# Patient Record
Sex: Male | Born: 1990 | Race: White | Hispanic: No | Marital: Single | Smoking: Current every day smoker
Health system: Southern US, Community
[De-identification: ages and names within clinical notes are randomized; demographics above are authoritative.]

## PROBLEM LIST (undated history)

## (undated) DIAGNOSIS — F419 Anxiety disorder, unspecified: Secondary | ICD-10-CM

---
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
## 2017-06-11 ENCOUNTER — Emergency Department
Admission: EM | Admit: 2017-06-11 | Discharge: 2017-06-11 | Disposition: A | Discharge status: Home / Self Care | Payer: Self-pay | Attending: Emergency Medicine | Admitting: Emergency Medicine

## 2017-06-11 ENCOUNTER — Encounter: Payer: Self-pay | Admitting: Emergency Medicine

## 2017-06-11 DIAGNOSIS — F17210 Nicotine dependence, cigarettes, uncomplicated: Secondary | ICD-10-CM | POA: Insufficient documentation

## 2017-06-11 DIAGNOSIS — W57XXXA Bitten or stung by nonvenomous insect and other nonvenomous arthropods, initial encounter: Secondary | ICD-10-CM | POA: Insufficient documentation

## 2017-06-11 DIAGNOSIS — E860 Dehydration: Secondary | ICD-10-CM | POA: Insufficient documentation

## 2017-06-11 DIAGNOSIS — L989 Disorder of the skin and subcutaneous tissue, unspecified: Secondary | ICD-10-CM | POA: Insufficient documentation

## 2017-06-11 NOTE — ED Notes (Signed)
Patient presents to the ED after removing a tick this morning.  Patient states since then he is having muscle aches, tremors, poor circulation, and dry mouth.  Patient's capillary refill is within normal limits.  Patient is speaking clearly with even and not labored respirations.  No obvious distress at this time.  Patient states he had some of those symptoms when he was younger and would get stung by a bee.

## 2017-06-11 NOTE — ED Provider Notes (Signed)
[facility] Emergency Department Provider Note  ____________________________________________  Time seen: Approximately 3:48 PM  I have reviewed the triage vital signs and the nursing notes.   HISTORY  Chief Complaint Tick Removal    HPI Seth Nicholson is a 26 y.o. male who presents emergency department concerned about a tick bite. Patient reports that he was out all night at a friend's house and they weredrinking on the lids. Patient reports that this morning he awoke and saw a tick on his right lower extremity. Patient removed the tick successfully. Today, patient has felt "off" and reports that he is concerned he may have Lyme's or [facility] spotted fever. Patient reports that he consumed a significant amount of alcohol last night drinking for over 8 hours. Patient has not had any non-caffeinated fluids today. He reports that he feels slightly shaky, mild headache, mild nausea. No other complaints with no visual changes, chills, neck pain, chest pain, shortness breath, abdominal pain, vomiting, diarrhea, rashes.   History reviewed. No pertinent past medical history.  There are no active problems to display for this patient.   History reviewed. No pertinent surgical history.  Prior to Admission medications   Not on File    Allergies Barbiturates  No family history on file.  Social History Social History  Substance Use Topics  . Smoking status: Current Every Day Smoker    Packs/day: 1.00    Types: Cigarettes  . Smokeless tobacco: Not on file  . Alcohol use Not on file     Review of Systems  Constitutional: No fever/chills Eyes: No visual changes.  Cardiovascular: no chest pain. Respiratory: no cough. No SOB. Gastrointestinal: No abdominal pain.  No nausea, no vomiting.  No diarrhea.  No constipation. Musculoskeletal: Negative for musculoskeletal pain. Skin: Negative for rash, abrasions, lacerations, ecchymosis.Positive for tick bite to  the right lower extremity. Neurological: Negative for headaches, focal weakness or numbness. 10-point ROS otherwise negative.  ____________________________________________   PHYSICAL EXAM:  VITAL SIGNS: ED Triage Vitals  Enc Vitals Group     BP 06/11/17 1527 (!) 161/100     Pulse Rate 06/11/17 1527 (!) 102     Resp 06/11/17 1527 18     Temp 06/11/17 1527 98.1 F (36.7 C)     Temp Source 06/11/17 1527 Oral     SpO2 06/11/17 1527 96 %     Weight 06/11/17 1528 200 lb (90.7 kg)     Height 06/11/17 1528 5\' 8"  (1.727 m)     Head Circumference --      Peak Flow --      Pain Score --      Pain Loc --      Pain Edu? --      Excl. in GC? --      Constitutional: Alert and oriented. Well appearing and in no acute distress. Eyes: Conjunctivae are normal. PERRL. EOMI. Head: Atraumatic. ENT:      Ears:       Nose: No congestion/rhinnorhea.      Mouth/Throat: Mucous membranes are moist.  Neck: No stridor.   Hematological/Lymphatic/Immunilogical: No cervical lymphadenopathy. Cardiovascular: Normal rate, regular rhythm. Normal S1 and S2.  Good peripheral circulation. Respiratory: Normal respiratory effort without tachypnea or retractions. Lungs CTAB. Good air entry to the bases with no decreased or absent breath sounds. Musculoskeletal: Full range of motion to all extremities. No gross deformities appreciated. Neurologic:  Normal speech and language. No gross focal neurologic deficits are appreciated.  Skin:  Skin is warm, dry and intact. No rash noted. Punctate lesion noted to the right lower extremity from tick bite. No remaining tick bite. No surrounding lesions or rashes. Psychiatric: Mood and affect are normal. Speech and behavior are normal. Patient exhibits appropriate insight and judgement.   ____________________________________________   LABS (all labs ordered are listed, but only abnormal results are displayed)  Labs Reviewed - No data to  display ____________________________________________  EKG   ____________________________________________  RADIOLOGY   No results found.  ____________________________________________    PROCEDURES  Procedure(s) performed:    Procedures    Medications - No data to display   ____________________________________________   INITIAL IMPRESSION / ASSESSMENT AND PLAN / ED COURSE  Pertinent labs & imaging results that were available during my care of the patient were reviewed by me and considered in my medical decision making (see chart for details).  Review of the [place] CSRS was performed in accordance of the [facility] prior to dispensing any controlled drugs.     Patient's diagnosis is consistent with tick bite. Patient presented to the emergency department with vague symptoms status post tick bite yesterday. Patient has also been drinking significant amounts of alcohol. Patient's symptoms are more consistent with a newer and mild dehydration. Tick was on patient less than 72 hours, no surrounding rashes, no other symptoms consistent with tick borne illness. At this time, no indication for labs or empiric treatment with doxycycline. She will follow up primary care as needed. Patient is given ED precautions to return to the ED for any worsening or new symptoms.     ____________________________________________  FINAL CLINICAL IMPRESSION(S) / ED DIAGNOSES  Final diagnoses:  Tick bite, initial encounter      NEW MEDICATIONS STARTED DURING THIS VISIT:  New Prescriptions   No medications on file        This chart was dictated using voice recognition software/Dragon. Despite best efforts to proofread, errors can occur which can change the meaning. Any change was purely unintentional.    Racheal Patches, PA-C 06/11/17 1553    Sharman Cheek, MD 06/13/17 0120

## 2017-06-11 NOTE — ED Triage Notes (Signed)
States removed tick from R shin this am. Concerned about contracting tick bourne disease.

## 2018-02-28 ENCOUNTER — Other Ambulatory Visit: Payer: Self-pay

## 2018-02-28 ENCOUNTER — Emergency Department
Admission: EM | Admit: 2018-02-28 | Discharge: 2018-02-28 | Disposition: A | Discharge status: Home / Self Care | Payer: BLUE CROSS/BLUE SHIELD | Attending: Emergency Medicine | Admitting: Emergency Medicine

## 2018-02-28 ENCOUNTER — Encounter: Payer: Self-pay | Admitting: Emergency Medicine

## 2018-02-28 DIAGNOSIS — F419 Anxiety disorder, unspecified: Secondary | ICD-10-CM | POA: Diagnosis not present

## 2018-02-28 DIAGNOSIS — R519 Headache, unspecified: Secondary | ICD-10-CM

## 2018-02-28 DIAGNOSIS — R51 Headache: Secondary | ICD-10-CM | POA: Diagnosis present

## 2018-02-28 DIAGNOSIS — F17210 Nicotine dependence, cigarettes, uncomplicated: Secondary | ICD-10-CM | POA: Insufficient documentation

## 2018-02-28 LAB — BASIC METABOLIC PANEL
Anion gap: 11 (ref 5–15)
BUN: 11 mg/dL (ref 6–20)
CO2: 24 mmol/L (ref 22–32)
CREATININE: 0.88 mg/dL (ref 0.61–1.24)
Calcium: 9.2 mg/dL (ref 8.9–10.3)
Chloride: 100 mmol/L — ABNORMAL LOW (ref 101–111)
GFR calc Af Amer: >60 mL/min (ref >60)
GLUCOSE: 96 mg/dL (ref 65–99)
Potassium: 3.5 mmol/L (ref 3.5–5.1)
SODIUM: 135 mmol/L (ref 135–145)

## 2018-02-28 LAB — TROPONIN I: Troponin I: <0.03 ng/mL (ref <0.03)

## 2018-02-28 LAB — CBC
HEMATOCRIT: 46.1 % (ref 40.0–52.0)
Hemoglobin: 16.2 g/dL (ref 13.0–18.0)
MCH: 31.6 pg (ref 26.0–34.0)
MCHC: 35.2 g/dL (ref 32.0–36.0)
MCV: 89.8 fL (ref 80.0–100.0)
PLATELETS: 309 10*3/uL (ref 150–440)
RBC: 5.13 MIL/uL (ref 4.40–5.90)
RDW: 12.5 % (ref 11.5–14.5)
WBC: 10.8 10*3/uL — AB (ref 3.8–10.6)

## 2018-02-28 NOTE — ED Triage Notes (Signed)
Heart racing and headache since 10am.  Was up all night drinking til 4or 5am.

## 2018-02-28 NOTE — ED Provider Notes (Signed)
[facility] Emergency Department Provider Note  ____________________________________________   First MD Initiated Contact with Patient 02/28/18 1329     (approximate)  I have reviewed the triage vital signs and the nursing notes.   HISTORY  Chief Complaint Headache and Palpitations   HPI Seth Nicholson is a 27 y.o. male who presents to the emergency department for treatment and evaluation of palpitations and headache.  Patient states that he was "up all night drinking" and then when he tried to lay down, he felt that his heart was beating too fast and may be skipping a beat which made him feel "panicky".  He states that 2 days ago he did some cocaine, but has not had any chest pain or shortness of breath since that time.  No alleviating measures have been attempted for this complaint prior to arrival.  History reviewed. No pertinent past medical history.  There are no active problems to display for this patient.   History reviewed. No pertinent surgical history.  Prior to Admission medications   Not on File    Allergies Barbiturates  No family history on file.  Social History Social History   Tobacco Use  . Smoking status: Current Every Day Smoker    Packs/day: 1.00    Types: Cigarettes  . Smokeless tobacco: Never Used  Substance Use Topics  . Alcohol use: Yes  . Drug use: Yes    Types: Cocaine    Review of Systems  Constitutional: No fever/chills Eyes: No visual changes. ENT: No sore throat. Cardiovascular: Denies chest pain.  Positive for palpitations Respiratory: Denies shortness of breath. Gastrointestinal: No abdominal pain.  No nausea, no vomiting.  No diarrhea.  No constipation. Genitourinary: Negative for dysuria. Musculoskeletal: Negative for back pain. Skin: Negative for rash. Neurological: Negative for headaches, focal weakness or numbness. ____________________________________________   PHYSICAL EXAM:  VITAL  SIGNS: ED Triage Vitals [02/28/18 1112]  Enc Vitals Group     BP (!) 164/96     Pulse Rate 95     Resp 16     Temp 98.4 F (36.9 C)     Temp Source Oral     SpO2 98 %     Weight 190 lb (86.2 kg)     Height 5\' 8"  (1.727 m)     Head Circumference      Peak Flow      Pain Score 5     Pain Loc      Pain Edu?      Excl. in GC?     Constitutional: Alert and oriented. Well appearing and in no acute distress. Eyes: Conjunctivae are normal. PERRL. Head: Atraumatic. Nose: No congestion/rhinnorhea. Mouth/Throat: Mucous membranes are moist.  Oropharynx non-erythematous. Neck: No stridor.   Cardiovascular: Normal rate, regular rhythm. Grossly normal heart sounds.  Good peripheral circulation. Respiratory: Normal respiratory effort.  No retractions. Lungs CTAB. Gastrointestinal: Soft and nontender. No distention. No abdominal bruits. No CVA tenderness. Musculoskeletal: No lower extremity tenderness nor edema.  No joint effusions. Neurologic:  Normal speech and language. No gross focal neurologic deficits are appreciated. No gait instability. Skin:  Skin is warm, dry and intact. No rash noted. Psychiatric: Mood and affect are normal. Speech and behavior are normal.  ____________________________________________   LABS (all labs ordered are listed, but only abnormal results are displayed)  Labs Reviewed  BASIC METABOLIC PANEL - Abnormal; Notable for the following components:      Result Value   Chloride 100 (*)  All other components within normal limits  CBC - Abnormal; Notable for the following components:   WBC 10.8 (*)    All other components within normal limits  TROPONIN I   ____________________________________________  EKG  Normal sinus rhythm, normal axis, no ectopy ____________________________________________  RADIOLOGY  Official radiology report(s): No results found.  ____________________________________________   PROCEDURES  Procedure(s) performed:  None  Procedures  Critical Care performed: No  ____________________________________________   INITIAL IMPRESSION / ASSESSMENT AND PLAN / ED COURSE  As part of my medical decision making, I reviewed the following data within the electronic MEDICAL RECORD NUMBER   27 year old male presenting to the emergency department for evaluation of palpitations and headache. Symptoms have resolved while here. He feels reassured by his negative lab studies and EKG. He was strongly advised to get alcohol and drug treatment. Patient states that he has plans to stop all his high risk behaviors because it is "time to grow up." He was advised to return to the ER for symptoms that change or worsen or for new concerns. ____________________________________________   FINAL CLINICAL IMPRESSION(S) / ED DIAGNOSES  Final diagnoses:  Anxiety  Intractable episodic headache, unspecified headache type     ED Discharge Orders    None       Note:  This document was prepared using Dragon voice recognition software and may include unintentional dictation errors.    Chinita Pester, FNP 02/28/18 1620    Jene Every, MD 03/03/18 [phone]

## 2018-02-28 NOTE — ED Provider Notes (Signed)
ED ECG REPORT I, Seth Nicholson, the attending physician, personally viewed and interpreted this ECG.  Date: 02/28/2018  Rhythm: normal sinus rhythm QRS Axis: normal Intervals: normal ST/T Wave abnormalities: normal Narrative Interpretation: no evidence of acute ischemia    Seth EveryKinner, Krystale Rinkenberger, MD 02/28/18 1413

## 2018-07-02 ENCOUNTER — Other Ambulatory Visit: Payer: Self-pay

## 2018-07-02 ENCOUNTER — Emergency Department
Admission: EM | Admit: 2018-07-02 | Discharge: 2018-07-02 | Disposition: A | Discharge status: Home / Self Care | Payer: BLUE CROSS/BLUE SHIELD | Attending: Emergency Medicine | Admitting: Emergency Medicine

## 2018-07-02 ENCOUNTER — Encounter: Payer: Self-pay | Admitting: Emergency Medicine

## 2018-07-02 DIAGNOSIS — F17210 Nicotine dependence, cigarettes, uncomplicated: Secondary | ICD-10-CM | POA: Diagnosis not present

## 2018-07-02 DIAGNOSIS — Y999 Unspecified external cause status: Secondary | ICD-10-CM | POA: Insufficient documentation

## 2018-07-02 DIAGNOSIS — Y939 Activity, unspecified: Secondary | ICD-10-CM | POA: Insufficient documentation

## 2018-07-02 DIAGNOSIS — B354 Tinea corporis: Secondary | ICD-10-CM | POA: Diagnosis not present

## 2018-07-02 DIAGNOSIS — R51 Headache: Secondary | ICD-10-CM | POA: Diagnosis present

## 2018-07-02 DIAGNOSIS — S80861A Insect bite (nonvenomous), right lower leg, initial encounter: Secondary | ICD-10-CM | POA: Diagnosis not present

## 2018-07-02 DIAGNOSIS — Y929 Unspecified place or not applicable: Secondary | ICD-10-CM | POA: Diagnosis not present

## 2018-07-02 DIAGNOSIS — W57XXXA Bitten or stung by nonvenomous insect and other nonvenomous arthropods, initial encounter: Secondary | ICD-10-CM | POA: Diagnosis not present

## 2018-07-02 MED ORDER — ONDANSETRON 4 MG PO TBDP: 4.0000 mg | ORAL_TABLET | Freq: Once | ORAL | Status: DC

## 2018-07-02 MED ORDER — TERBINAFINE HCL 1 % EX CREA: 1.0000 "application " | TOPICAL_CREAM | Freq: Two times a day (BID) | CUTANEOUS | 1 refills | Status: DC

## 2018-07-02 MED ORDER — DOXYCYCLINE HYCLATE 100 MG PO CAPS: 100.0000 mg | ORAL_CAPSULE | Freq: Two times a day (BID) | ORAL | 0 refills | Status: DC

## 2018-07-02 MED ORDER — ONDANSETRON 4 MG PO TBDP
ORAL_TABLET | ORAL | Status: AC
  Filled 2018-07-02: qty 1

## 2018-07-02 NOTE — Discharge Instructions (Addendum)
Follow-up with [facility] acute care or primary care of your choice. Begin taking doxycycline twice daily for the next 10 days.  This is for your tick bite. Use Lamisil cream to your rash on your forearm and abdomen.  This medication is for your ringworm.  This cream also has 1 refill.

## 2018-07-02 NOTE — ED Provider Notes (Signed)
[facility] Emergency Department Provider Note  ____________________________________________   First MD Initiated Contact with Patient 07/02/18 0801     (approximate)  I have reviewed the triage vital signs and the nursing notes.   HISTORY  Chief Complaint Tick Removal  HPI Seth Nicholson is a 27 y.o. male is here with complaint of headache and fever at home since pulling a tick off his ankle 2 days ago.  Patient states the area itches.  He believes that the tick had a spot on it making him think that this is a [place] tick.  He has removed the tick intact but still continues to have problems in this area.  He also has a rash to his forearm and on his right abdomen that has been there for several weeks.  He states the areas seem to be getting bigger.  Occasionally they itch.  He also is very anxious.  He states that he drank 12 beers last evening.  He denies the use of any recreational drugs.  He denies any pain.   History reviewed. No pertinent past medical history.  There are no active problems to display for this patient.   History reviewed. No pertinent surgical history.  Prior to Admission medications   Medication Sig Start Date End Date Taking? Authorizing Provider  doxycycline (VIBRAMYCIN) 100 MG capsule Take 1 capsule (100 mg total) by mouth 2 (two) times daily. 07/02/18   Tommi RumpsSummers, Jourdyn Hasler L, PA-C  terbinafine (LAMISIL) 1 % cream Apply 1 application topically 2 (two) times daily. 07/02/18   Tommi RumpsSummers, Rayshad Riviello L, PA-C    Allergies Barbiturates  No family history on file.  Social History Social History   Tobacco Use  . Smoking status: Current Every Day Smoker    Packs/day: 1.00    Types: Cigarettes  . Smokeless tobacco: Never Used  Substance Use Topics  . Alcohol use: Yes  . Drug use: Yes    Types: Cocaine    Review of Systems Constitutional: No fever/chills Cardiovascular: Denies chest pain. Respiratory: Denies shortness of  breath. Gastrointestinal: No abdominal pain.  No nausea, no vomiting.   Musculoskeletal: Negative for back pain. Skin: Single tick bite.  Positive for rash. Neurological: Negative for headaches, focal weakness or numbness. ____________________________________________   PHYSICAL EXAM:  VITAL SIGNS: ED Triage Vitals  Enc Vitals Group     BP 07/02/18 0738 (!) 152/93     Pulse Rate 07/02/18 0736 (!) 107     Resp 07/02/18 0736 16     Temp 07/02/18 0736 98.5 F (36.9 C)     Temp Source 07/02/18 0736 Oral     SpO2 07/02/18 0736 97 %     Weight 07/02/18 0738 185 lb (83.9 kg)     Height 07/02/18 0738 5\' 8"  (1.727 m)     Head Circumference --      Peak Flow --      Pain Score 07/02/18 0738 0     Pain Loc --      Pain Edu? --      Excl. in GC? --    Constitutional: Alert and oriented. Well appearing and in no acute distress. Eyes: Conjunctivae are normal.  Head: Atraumatic. Nose: No congestion/rhinnorhea. Mouth/Throat: Mucous membranes are moist.  Oropharynx non-erythematous. Neck: No stridor.   Hematological/Lymphatic/Immunilogical: No cervical lymphadenopathy. Cardiovascular: Normal rate, regular rhythm. Grossly normal heart sounds.  Good peripheral circulation. Respiratory: Normal respiratory effort.  No retractions. Lungs CTAB. Musculoskeletal: Moves upper and lower extremities without  any difficulty.  Normal gait was noted. Neurologic:  Normal speech and language. No gross focal neurologic deficits are appreciated.  Skin:  Skin is warm, dry and intact.  There is a single bite to the right ankle that is erythematous.  No drainage is noted.  No soft tissue swelling is present in the area.  On the forearm and abdomen there is a circular area x3 with discrete margins and central clearing consistent with tinea corpus.  Area is nontender to touch. Psychiatric: Mood and affect are normal. Speech and behavior are normal.  ____________________________________________   LABS (all labs  ordered are listed, but only abnormal results are displayed)  Labs Reviewed - No data to display   PROCEDURES  Procedure(s) performed: None  Procedures  Critical Care performed: No  ____________________________________________   INITIAL IMPRESSION / ASSESSMENT AND PLAN / ED COURSE  As part of my medical decision making, I reviewed the following data within the electronic MEDICAL RECORD NUMBER Notes from prior ED visits and [place] Controlled Substance Database  Patient is very anxious about his tick bite.  Patient was placed on doxycycline 100 mg twice daily for 10 days.  Lamisil 1% cream to apply to area twice daily.  Patient is to follow-up with [facility] if any continued problems.  ____________________________________________   FINAL CLINICAL IMPRESSION(S) / ED DIAGNOSES  Final diagnoses:  Tick bite of lower leg, right, initial encounter  Tinea corporis     ED Discharge Orders        Ordered    doxycycline (VIBRAMYCIN) 100 MG capsule  2 times daily     07/02/18 0907    terbinafine (LAMISIL) 1 % cream  2 times daily     07/02/18 0907       Note:  This document was prepared using Dragon voice recognition software and may include unintentional dictation errors.    Tommi Rumps, PA-C 07/02/18 1544    Jeanmarie Plant, MD 07/07/18 [phone]

## 2018-07-02 NOTE — ED Notes (Addendum)
See triage note  Presents with tick bite to right ankle /lower leg  States he thinks the tick was there for about 2 days  Area is red  No swelling  But states he woke up this am with diaphoresis and feeling very anxious  Also states he is dizzy and nauseated   Afebrile on arrival

## 2018-07-02 NOTE — ED Notes (Signed)
First Nurse Note: pt c/o tick bite 2 days ago. Pt in NAD

## 2018-07-02 NOTE — ED Triage Notes (Signed)
Pt states that he was bit by a tick on his right ankle 2 days ago. Pt states that this morning he had fever of 100.0, pt afebrile in triage, denies taking anything for fever. PT states that he also has dry mouth, and the bit itches and is swollen. Pt is in NAD.

## 2018-08-01 ENCOUNTER — Encounter: Payer: Self-pay | Admitting: Emergency Medicine

## 2018-08-01 ENCOUNTER — Other Ambulatory Visit: Payer: Self-pay

## 2018-08-01 ENCOUNTER — Emergency Department: Payer: BLUE CROSS/BLUE SHIELD

## 2018-08-01 ENCOUNTER — Emergency Department
Admission: EM | Admit: 2018-08-01 | Discharge: 2018-08-01 | Disposition: A | Discharge status: Home / Self Care | Payer: BLUE CROSS/BLUE SHIELD | Attending: Emergency Medicine | Admitting: Emergency Medicine

## 2018-08-01 DIAGNOSIS — F17210 Nicotine dependence, cigarettes, uncomplicated: Secondary | ICD-10-CM | POA: Diagnosis not present

## 2018-08-01 DIAGNOSIS — F410 Panic disorder [episodic paroxysmal anxiety] without agoraphobia: Secondary | ICD-10-CM | POA: Diagnosis not present

## 2018-08-01 DIAGNOSIS — F419 Anxiety disorder, unspecified: Secondary | ICD-10-CM

## 2018-08-01 DIAGNOSIS — Z77098 Contact with and (suspected) exposure to other hazardous, chiefly nonmedicinal, chemicals: Secondary | ICD-10-CM | POA: Insufficient documentation

## 2018-08-01 LAB — CBC WITH DIFFERENTIAL/PLATELET
Basophils Absolute: 0.1 10*3/uL (ref 0–0.1)
Basophils Relative: 0 %
Eosinophils Absolute: 0.1 10*3/uL (ref 0–0.7)
Eosinophils Relative: 1 %
HCT: 45.2 % (ref 40.0–52.0)
HEMOGLOBIN: 16.1 g/dL (ref 13.0–18.0)
Lymphocytes Relative: 11 %
Lymphs Abs: 1.7 10*3/uL (ref 1.0–3.6)
MCH: 32.6 pg (ref 26.0–34.0)
MCHC: 35.6 g/dL (ref 32.0–36.0)
MCV: 91.4 fL (ref 80.0–100.0)
MONOS PCT: 5 %
Monocytes Absolute: 0.7 10*3/uL (ref 0.2–1.0)
NEUTROS PCT: 83 %
Neutro Abs: 12.3 10*3/uL — ABNORMAL HIGH (ref 1.4–6.5)
Platelets: 234 10*3/uL (ref 150–440)
RBC: 4.95 MIL/uL (ref 4.40–5.90)
RDW: 12 % (ref 11.5–14.5)
WBC: 14.8 10*3/uL — AB (ref 3.8–10.6)

## 2018-08-01 LAB — COMPREHENSIVE METABOLIC PANEL
ALK PHOS: 78 U/L (ref 38–126)
ALT: 36 U/L (ref 0–44)
ANION GAP: 11 (ref 5–15)
AST: 26 U/L (ref 15–41)
Albumin: 4.9 g/dL (ref 3.5–5.0)
BUN: 16 mg/dL (ref 6–20)
CALCIUM: 9 mg/dL (ref 8.9–10.3)
CO2: 21 mmol/L — AB (ref 22–32)
Chloride: 103 mmol/L (ref 98–111)
Creatinine, Ser: 0.9 mg/dL (ref 0.61–1.24)
GFR calc Af Amer: >60 mL/min (ref >60)
GFR calc non Af Amer: >60 mL/min (ref >60)
GLUCOSE: 79 mg/dL (ref 70–99)
Potassium: 3.9 mmol/L (ref 3.5–5.1)
Sodium: 135 mmol/L (ref 135–145)
Total Bilirubin: 1.3 mg/dL — ABNORMAL HIGH (ref 0.3–1.2)
Total Protein: 7.9 g/dL (ref 6.5–8.1)

## 2018-08-01 LAB — MAGNESIUM: Magnesium: 2.3 mg/dL (ref 1.7–2.4)

## 2018-08-01 NOTE — Discharge Instructions (Addendum)
Follow-up with [facility] about panic attacks. Consider decreasing the amount of alcohol that you are drinking.  Consider talking to the counselors at [facility] also about this.  Increase fluids today.

## 2018-08-01 NOTE — ED Triage Notes (Signed)
Patient reports that he was using pesticide around his house yesterday and got some on his legs and arms. Reports since then he has noticed burning to legs and left arm. Patient also reports "stickiiness" in eyes and blurred vision since exposure. Patient reports showering to remove chemical this morning. Patient states he is having a panic attack due to symptoms. Reports history of panic attacks.

## 2018-08-01 NOTE — ED Provider Notes (Signed)
[facility] Emergency Department Provider Note  ____________________________________________   First MD Initiated Contact with Patient 08/01/18 1213     (approximate)  I have reviewed the triage vital signs and the nursing notes.   HISTORY  Chief Complaint Allergic Reaction and Panic Attack   HPI Seth Nicholson is a 27 y.o. male presents to the emergency department today with anxiety about being "poisoned by bug spray".  Patient states that last evening he sprayed some chemical bug spray inside his house and then left the house to go to out with friends.  He states that he realized that he had sprayed some of the bug spray on his skin but did not stay to shower it off until this morning.  Patient states that he is now worried that he is been poisoned by the bug spray.  He complains of nausea, abdominal pain, headache, dizziness and playing just not feeling well.  He also admits that while he was out with his friends that he consumed approximately 12 beers.  He denies any vomiting or diarrhea.  He denies any difficulty breathing, talking, swallowing, fever or chills.  He also complains of some blurred vision initially this morning.  He states he has a history of panic attacks in the past.  History reviewed. No pertinent past medical history.  There are no active problems to display for this patient.   History reviewed. No pertinent surgical history.  Prior to Admission medications   Not on File    Allergies Barbiturates  No family history on file.  Social History Social History   Tobacco Use  . Smoking status: Current Every Day Smoker    Packs/day: 1.00    Types: Cigarettes  . Smokeless tobacco: Never Used  Substance Use Topics  . Alcohol use: Yes  . Drug use: Yes    Types: Cocaine    Review of Systems Constitutional: No fever/chills ENT: No sore throat.  Positive blurred vision. Cardiovascular: Denies chest pain. Respiratory: Denies  shortness of breath. Gastrointestinal: No abdominal pain.  Positive nausea, no vomiting.  No diarrhea.  No constipation. Genitourinary: Negative for dysuria. Musculoskeletal: Negative for back pain. Skin: Negative for rash. Neurological: Positive for headaches, negative focal weakness or numbness. Psychological: Positive for panic attacks. ____________________________________________   PHYSICAL EXAM:  VITAL SIGNS: ED Triage Vitals  Enc Vitals Group     BP 08/01/18 1200 (!) 176/99     Pulse Rate 08/01/18 1200 (!) 101     Resp 08/01/18 1200 20     Temp 08/01/18 1200 98.4 F (36.9 C)     Temp Source 08/01/18 1200 Oral     SpO2 08/01/18 1200 97 %     Weight 08/01/18 1201 185 lb (83.9 kg)     Height 08/01/18 1201 5\' 8"  (1.727 m)     Head Circumference --      Peak Flow --      Pain Score 08/01/18 1200 2     Pain Loc --      Pain Edu? --      Excl. in GC? --    Constitutional: Alert and oriented. Well appearing and in no acute distress. Eyes: Conjunctivae are normal.  Head: Atraumatic. Neck: No stridor.   Hematological/Lymphatic/Immunilogical: No cervical lymphadenopathy. Cardiovascular: Normal rate, regular rhythm. Grossly normal heart sounds.  Good peripheral circulation. Respiratory: Normal respiratory effort.  No retractions. Lungs CTAB. Gastrointestinal: Soft and nontender. No distention.  All sounds normoactive x4 quadrants. Musculoskeletal: Moves upper and  lower extremities without any difficulty and normal gait was noted. Neurologic:  Normal speech and language. No gross focal neurologic deficits are appreciated.  Skin:  Skin is warm, dry and intact.  There is a diffuse faint erythema and small areas noted on the anterior portion of the lower legs.  No other rash was noted. Psychiatric: Mood and affect are normal. Speech and behavior are normal.  ____________________________________________   LABS (all labs ordered are listed, but only abnormal results are  displayed)  Labs Reviewed  CBC WITH DIFFERENTIAL/PLATELET - Abnormal; Notable for the following components:      Result Value   WBC 14.8 (*)    Neutro Abs 12.3 (*)    All other components within normal limits  COMPREHENSIVE METABOLIC PANEL - Abnormal; Notable for the following components:   CO2 21 (*)    Total Bilirubin 1.3 (*)    All other components within normal limits  MAGNESIUM  RADIOLOGY  ED MD interpretation:   Chest x-ray is negative for bronchitis or pneumonia.  Official radiology report(s): Dg Chest 2 View  Result Date: 08/01/2018 CLINICAL DATA:  Exposure to pesticide EXAM: CHEST - 2 VIEW COMPARISON:  None. FINDINGS: The heart size and mediastinal contours are within normal limits. Both lungs are clear. The visualized skeletal structures are unremarkable. IMPRESSION: No active cardiopulmonary disease. Electronically Signed   By: Jasmine PangKim  Fujinaga M.D.   On: 08/01/2018 14:04   ____________________________________________   PROCEDURES  Procedure(s) performed: None  Procedures  Critical Care performed: No  ____________________________________________   INITIAL IMPRESSION / ASSESSMENT AND PLAN / ED COURSE  As part of my medical decision making, I reviewed the following data within the electronic MEDICAL RECORD NUMBER Notes from prior ED visits and [place] Controlled Substance Database  Patient was reassured that there is no evidence that he is been systemically poisoned by the bug spray that he used last evening.  We did however discuss his anxiety and this is the third time that he has been seen in the ED for anxiety related issues.  Patient has been given information about [facility] but has not followed through with seeing a counselor.  Today we also discussed his alcohol use and that is been documented twice that he was committed to decreasing his alcohol intake which could help with his anxiety.  He was discharged with information and encouraged to decrease his alcohol use and  follow-up with [facility].  ____________________________________________   FINAL CLINICAL IMPRESSION(S) / ED DIAGNOSES  Final diagnoses:  Anxiety  Exposure to chemical irritant     ED Discharge Orders    None       Note:  This document was prepared using Dragon voice recognition software and may include unintentional dictation errors.    Tommi RumpsSummers, Jamillah Camilo L, PA-C 08/01/18 1553    Arnaldo NatalMalinda, Paul F, MD 08/01/18 [phone]

## 2018-08-01 NOTE — ED Notes (Signed)
Pt is still very anxious.

## 2018-08-01 NOTE — ED Notes (Signed)
See triage note  States he used a pesticide yesterday at home  Thinks he got some on skin and rubbed his eyes  States he showered and rinsed his eyes out but conts to have blurred vision  Also this has thrown him into a panic attack

## 2019-11-30 ENCOUNTER — Emergency Department
Admission: EM | Admit: 2019-11-30 | Discharge: 2019-11-30 | Disposition: A | Discharge status: Home / Self Care | Payer: BLUE CROSS/BLUE SHIELD | Attending: Emergency Medicine | Admitting: Emergency Medicine

## 2019-11-30 ENCOUNTER — Encounter: Payer: Self-pay | Admitting: Emergency Medicine

## 2019-11-30 ENCOUNTER — Other Ambulatory Visit: Payer: Self-pay

## 2019-11-30 DIAGNOSIS — R21 Rash and other nonspecific skin eruption: Secondary | ICD-10-CM | POA: Insufficient documentation

## 2019-11-30 DIAGNOSIS — L259 Unspecified contact dermatitis, unspecified cause: Secondary | ICD-10-CM

## 2019-11-30 DIAGNOSIS — F17210 Nicotine dependence, cigarettes, uncomplicated: Secondary | ICD-10-CM | POA: Diagnosis not present

## 2019-11-30 MED ORDER — PREDNISONE 10 MG PO TABS: 10.0000 mg | ORAL_TABLET | Freq: Every day | ORAL | 0 refills | Status: DC

## 2019-11-30 MED ORDER — TRIAMCINOLONE ACETONIDE 0.1 % EX CREA: 1.0000 "application " | TOPICAL_CREAM | Freq: Four times a day (QID) | CUTANEOUS | 0 refills | Status: DC

## 2019-11-30 NOTE — ED Provider Notes (Signed)
[facility] Emergency Department Provider Note  ____________________________________________   None    (approximate)   I have reviewed the triage vital signs and the nursing notes.       HISTORY  Chief Complaint Rash    HPI Seth Nicholson is a 28 y.o. male presents to the emergency department with a complaint of rash.  Patient presented to emergency department with scattered hives, pruritic lesions scattered across the torso, legs.  Patient states that he noticed a spot to his abdomen 3 to 4 days ago.  This has been spreading.  Patient states that over the counter antihistamines have improved pruritus somewhat but have not improved the rash.  No new foods, medications, soaps, shampoos, topicals, laundry detergents.  Patient states that his girlfriend also experienced symptoms for 2 days but she has resolved but he is worsened.  He denies any other complaints at this time.Marland Kitchen       No past medical history on file.  There are no problems to display for this patient.   No past surgical history on file.  Prior to Admission medications   Medication Sig Start Date End Date Taking? Authorizing Provider  predniSONE (DELTASONE) 10 MG tablet Take 1 tablet (10 mg total) by mouth daily. 11/30/19   Cuthriell, Delorise Royals, PA-C  triamcinolone cream (KENALOG) 0.1 % Apply 1 application topically 4 (four) times daily. 11/30/19   Cuthriell, Delorise Royals, PA-C    Allergies Barbiturates  No family history on file.  Social History Social History   Tobacco Use  . Smoking status: Current Every Day Smoker    Packs/day: 1.00    Types: Cigarettes  . Smokeless tobacco: Never Used  Substance Use Topics  . Alcohol use: Yes  . Drug use: Yes    Types: Cocaine    Review of Systems Constitutional: no fever ENT: no nasal congestion/rhinorhea. no sore throat Cardiovascular: no chest pain. Respiratory: no cough. no shortness of breath/difficulty breathing  Gastroenterology: no abdominal pain Musculoskeletal: no for musculoskeletal pain Integumentary: Scattered rash on torso and extremities Neurological: No focal weakness nor numbness.   ____________________________________________   PHYSICAL EXAM:  VITAL SIGNS: ED Triage Vitals  Enc Vitals Group     BP      Pulse      Resp      Temp      Temp src      SpO2      Weight      Height      Head Circumference      Peak Flow      Pain Score      Pain Loc      Pain Edu?      Excl. in GC?     Constitutional: Alert and oriented. Generally well appearing and in no acute distress. Eyes: Conjunctivae are normal.  Nose: No significant congestion/rhinnorhea. Mouth: No gross oropharyngeal edema. no erythema/edema Neck: No stridor.  No meningeal signs.   Cardiovascular: Grossly normal heart sounds. Respiratory: Normal respiratory effort without significant tachypnea and no observed retractions. Lungs CTAB Gastrointestinal: No significant visible abdominal wall findings.  Bowel sounds x4 quadrants. no tenderness to palpation. Musculoskeletal: No gross deformities of extremities. Neurologic:  Normal speech and language. No gross focal neurologic deficits are appreciated.  Skin:  Skin is warm, dry and intact.  Scattered maculopapular rash with scattered hives noted to the torso, extremities.  No patches.  No significant wheals.  No facial or neck involvement.  ____________________________________________   LABS (all labs ordered are listed, but only abnormal results are displayed)  Labs Reviewed - No data to display  ____________________________________________   RADIOLOGY   Official radiology report(s): No results found.  ____________________________________________    INITIAL IMPRESSION / MDM / ASSESSMENT AND PLAN / ED COURSE  As part of my medical decision making, I reviewed the following data within the electronic MEDICAL RECORD NUMBER Notes from prior ED visits and [place]  Controlled Substance Database      Clinical Impression: Contact dermatitis  Plan: Patient presented to the emergency department with worsening rash.  Over-the-counter medications helped with pruritus but did not improve rash.  No GI complaints.  No systemic involvement.  Patient has had scattered rash for 3 to 4 days.  At this time patient will be placed on steroid taper, triamcinolone topical.  Continue over-the-counter antihistamines as needed.  Follow-up primary care as needed.    ____________________________________________  Note:  This document was prepared using Systems analyst and may include unintentional dictation errors.    Brynda Peon 11/30/19 2141    Nena Polio, MD 11/30/19 [phone]

## 2019-11-30 NOTE — ED Triage Notes (Signed)
Pt reports rash for 3 to 4 days from his neck down. Pt reports using otc claritin and benadry with no relief.

## 2020-02-10 IMAGING — CR DG CHEST 2V
1 series · 2 of 2 positions shown · non-contrast
Comparison: None.

CLINICAL DATA: Exposure to pesticide

EXAM:
CHEST - 2 VIEW

[Series 1: dg chest 2 view · 0.14mm/px · 2 of 2 slices shown]
[im 1/2]
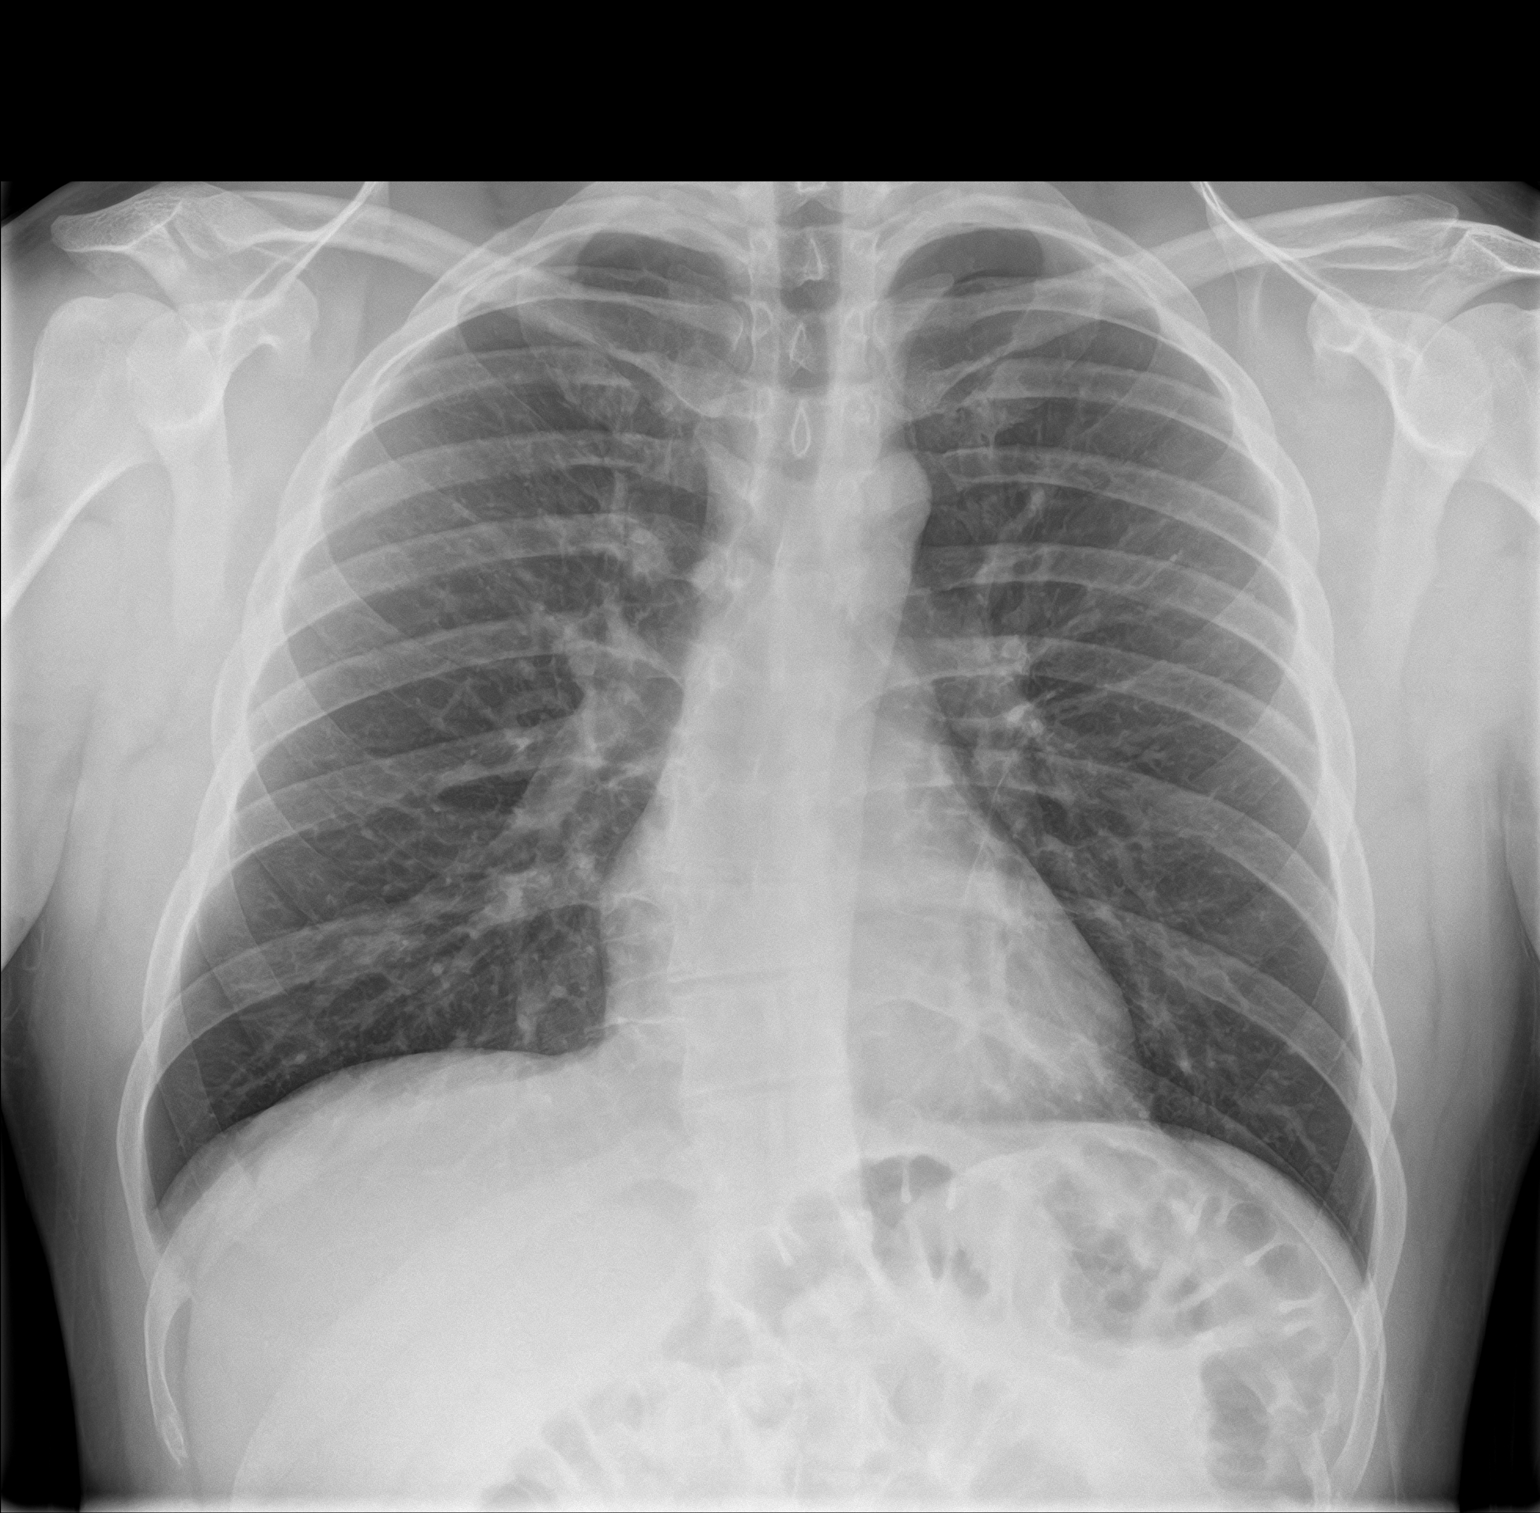
[im 2/2]
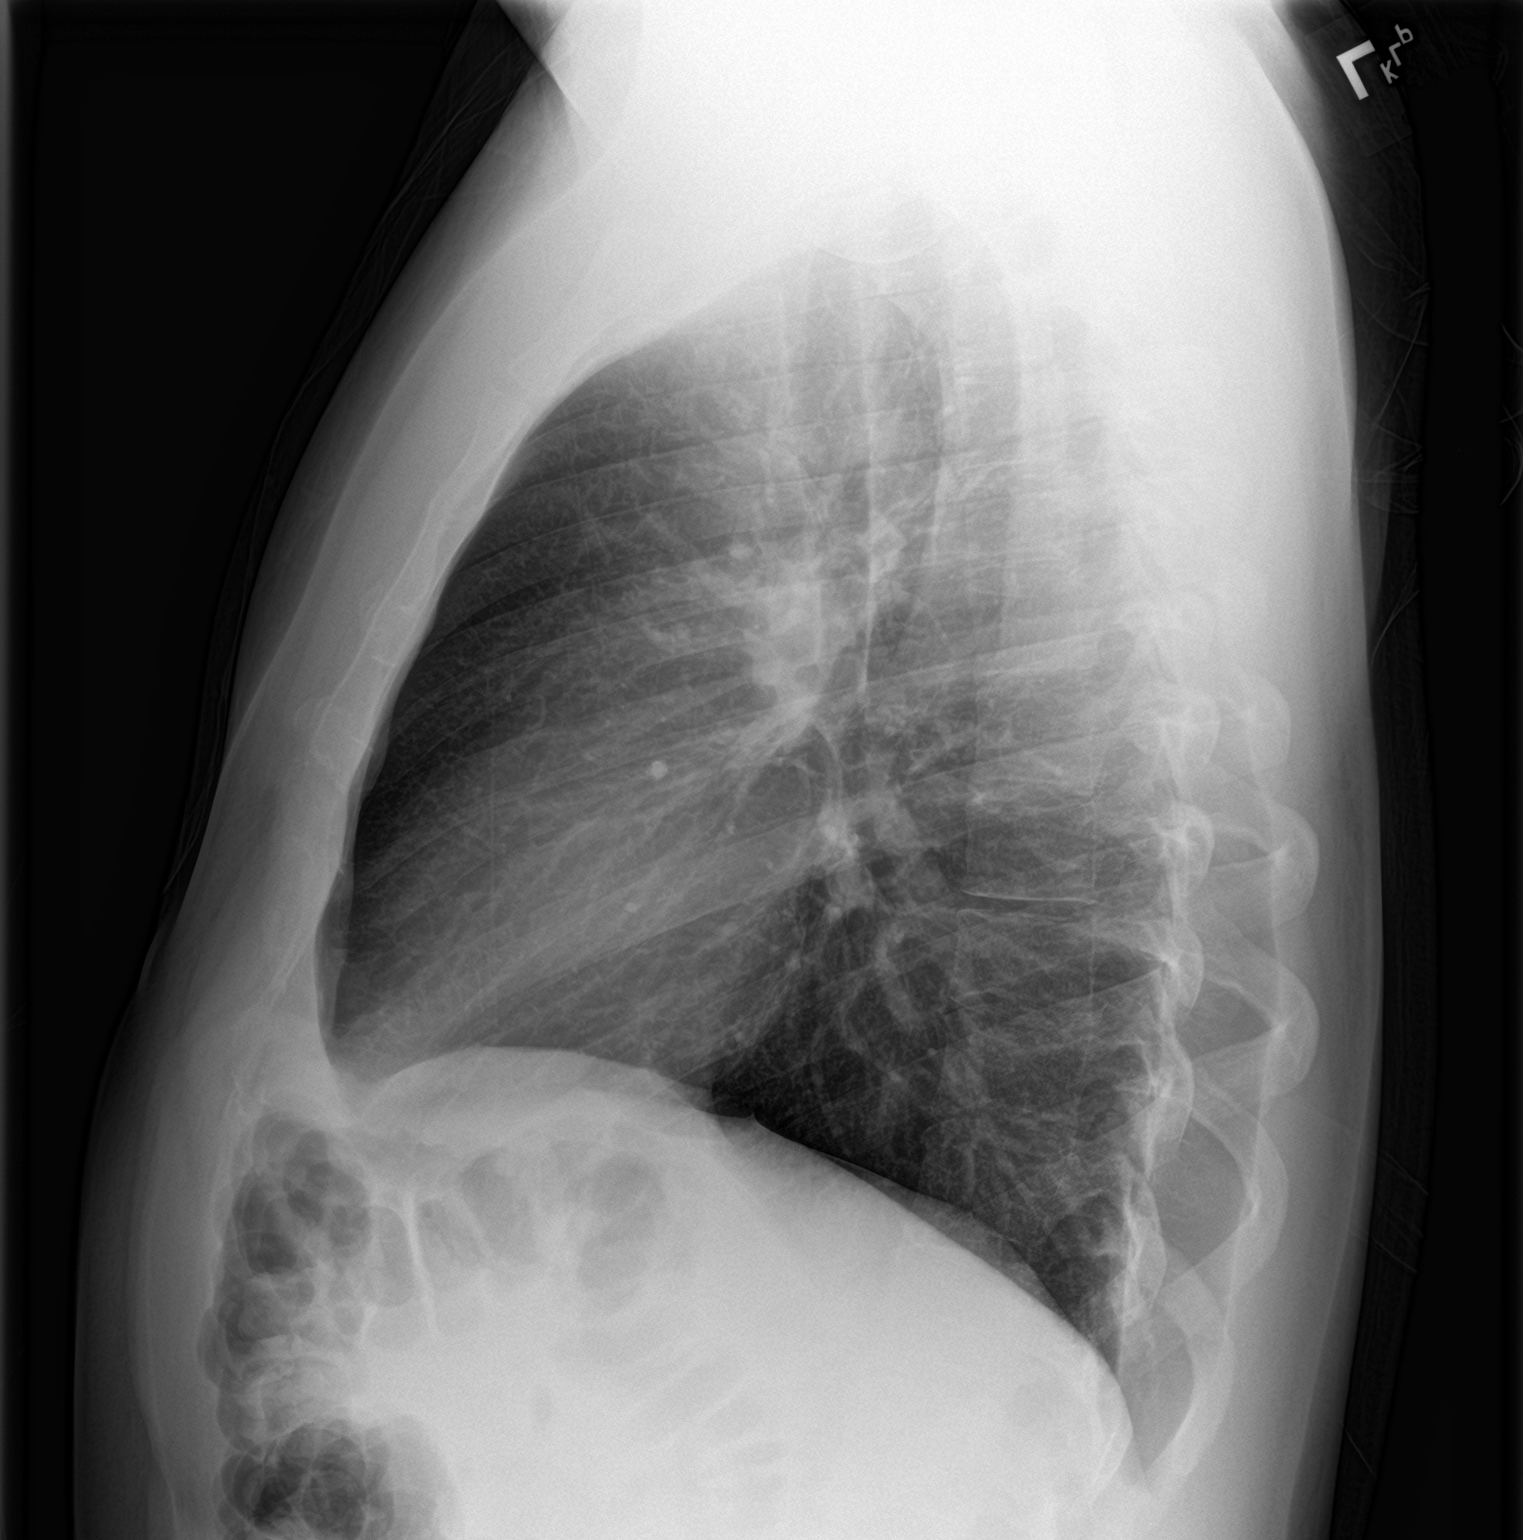

[2 of 2 positions shown; findings below may reference images not displayed]

FINDINGS: The heart size and mediastinal contours are within normal limits.
Both lungs are clear. The visualized skeletal structures are
unremarkable.
IMPRESSION: No active cardiopulmonary disease.

## 2023-06-09 ENCOUNTER — Ambulatory Visit
Admission: EM | Admit: 2023-06-09 | Discharge: 2023-06-09 | Disposition: A | Discharge status: Home / Self Care | Payer: BC Managed Care – PPO | Attending: Family Medicine | Admitting: Family Medicine

## 2023-06-09 ENCOUNTER — Encounter: Payer: Self-pay | Admitting: Emergency Medicine

## 2023-06-09 DIAGNOSIS — F410 Panic disorder [episodic paroxysmal anxiety] without agoraphobia: Secondary | ICD-10-CM | POA: Diagnosis present

## 2023-06-09 DIAGNOSIS — R448 Other symptoms and signs involving general sensations and perceptions: Secondary | ICD-10-CM | POA: Insufficient documentation

## 2023-06-09 HISTORY — DX: Anxiety disorder, unspecified: F41.9

## 2023-06-09 LAB — COMPREHENSIVE METABOLIC PANEL
ALT: 31 U/L (ref 0–44)
AST: 25 U/L (ref 15–41)
Albumin: 4.8 g/dL (ref 3.5–5.0)
Alkaline Phosphatase: 60 U/L (ref 38–126)
Anion gap: 9 (ref 5–15)
BUN: 11 mg/dL (ref 6–20)
CO2: 25 mmol/L (ref 22–32)
Calcium: 9.1 mg/dL (ref 8.9–10.3)
Chloride: 98 mmol/L (ref 98–111)
Creatinine, Ser: 0.9 mg/dL (ref 0.61–1.24)
GFR, Estimated: >60 mL/min (ref >60)
Glucose, Bld: 105 mg/dL — ABNORMAL HIGH (ref 70–99)
Potassium: 4 mmol/L (ref 3.5–5.1)
Sodium: 132 mmol/L — ABNORMAL LOW (ref 135–145)
Total Bilirubin: 0.9 mg/dL (ref 0.3–1.2)
Total Protein: 8.1 g/dL (ref 6.5–8.1)

## 2023-06-09 LAB — CBC WITH DIFFERENTIAL/PLATELET
Abs Immature Granulocytes: 0.03 10*3/uL (ref 0.00–0.07)
Basophils Absolute: 0.1 10*3/uL (ref 0.0–0.1)
Basophils Relative: 1 %
Eosinophils Absolute: 0.4 10*3/uL (ref 0.0–0.5)
Eosinophils Relative: 5 %
HCT: 44.4 % (ref 39.0–52.0)
Hemoglobin: 15.9 g/dL (ref 13.0–17.0)
Immature Granulocytes: 0 %
Lymphocytes Relative: 23 %
Lymphs Abs: 2 10*3/uL (ref 0.7–4.0)
MCH: 31.8 pg (ref 26.0–34.0)
MCHC: 35.8 g/dL (ref 30.0–36.0)
MCV: 88.8 fL (ref 80.0–100.0)
Monocytes Absolute: 0.7 10*3/uL (ref 0.1–1.0)
Monocytes Relative: 8 %
Neutro Abs: 5.5 10*3/uL (ref 1.7–7.7)
Neutrophils Relative %: 63 %
Platelets: 274 10*3/uL (ref 150–400)
RBC: 5 MIL/uL (ref 4.22–5.81)
RDW: 11.4 % — ABNORMAL LOW (ref 11.5–15.5)
WBC: 8.7 10*3/uL (ref 4.0–10.5)
nRBC: 0 % (ref 0.0–0.2)

## 2023-06-09 LAB — TSH: TSH: 2.232 u[IU]/mL (ref 0.350–4.500)

## 2023-06-09 NOTE — ED Provider Notes (Signed)
[facility]    CSN: [id number] Arrival date & time: 06/09/23  1614      History   Chief Complaint Chief Complaint  Patient presents with   tongue tingling     HPI Seth Nicholson is a 32 y.o. male.   HPI   Seth Nicholson presents for paniac attack. Has tingling at the tip of his tongue. He took a Benadryl 20-30 minutes ago. He has been having more frequently than he normally has in the past. Has 2 per week. Earlier this week he has a panic attack that started with his tongue tingling. He is trembling and feeling numbness in his face. Symptoms are somewhat improving.  Took Lexapro and Adderall in the past. He has not taken his Adderall yesterday for ADHD. BP at his aunts house was 160/110 when he was having a panic attack last week. He is afraid that he is having an analphaytic reaction as he had similar sx when he was a kid.       Past Medical History:  Diagnosis Date   Anxiety     There are no problems to display for this patient.   History reviewed. No pertinent surgical history.     Home Medications    Prior to Admission medications   Medication Sig Start Date End Date Taking? Authorizing Provider  amphetamine-dextroamphetamine (ADDERALL) 20 MG tablet Take by mouth. 04/09/23 06/16/23 Yes [provider]  triamcinolone cream (KENALOG) 0.1 % Apply 1 application topically 4 (four) times daily. 11/30/19   Cuthriell, Delorise Royals, PA-C    Family History History reviewed. No pertinent family history.  Social History Social History   Tobacco Use   Smoking status: Every Day    Packs/day: 1    Types: Cigarettes   Smokeless tobacco: Never  Vaping Use   Vaping Use: Every day  Substance Use Topics   Alcohol use: Yes   Drug use: Not Currently    Types: Cocaine     Allergies   Barbiturates   Review of Systems Review of Systems: negative unless otherwise stated in HPI.      Physical Exam Triage Vital Signs ED Triage Vitals  Enc Vitals Group      BP 06/09/23 1624 (!) 143/99     Pulse Rate 06/09/23 1624 64     Resp 06/09/23 1624 16     Temp 06/09/23 1624 98.2 F (36.8 C)     Temp Source 06/09/23 1624 Oral     SpO2 06/09/23 1624 100 %     Weight --      Height --      Head Circumference --      Peak Flow --      Pain Score 06/09/23 1622 0     Pain Loc --      Pain Edu? --      Excl. in GC? --    No data found.  Updated Vital Signs BP (!) 143/99 (BP Location: Left Arm)   Pulse 64   Temp 98.2 F (36.8 C) (Oral)   Resp 16   SpO2 100%   Visual Acuity Right Eye Distance:   Left Eye Distance:   Bilateral Distance:    Right Eye Near:   Left Eye Near:    Bilateral Near:     Physical Exam GEN:     alert, trembling male, anxious  HENT:  mucus membranes tachy, oropharyngeal without lesions or erythema,  nares patent, no nasal discharge EYES:   pupils equal  and reactive, EOM intact NECK:  supple, normal ROM, no goiter RESP:  clear to auscultation bilaterally, no increased work of breathing  CVS:   regular rate and rhythm, no murmur, distal pulses intact   EXT:   normal ROM, atraumatic, no edema  NEURO:  speech normal, alert and oriented, CN 2-12 grossly intact, normal coordination despite tremors, strength 5/5 BUE and BLE  Skin:   warm and clammy,  flushed cheeks and palms Psych:  Cognition and judgment appear intact. Alert, communicative  and cooperative with normal attention span and concentration. No apparent delusions, illusions, hallucinations     UC Treatments / Results  Labs (all labs ordered are listed, but only abnormal results are displayed) Labs Reviewed  CBC WITH DIFFERENTIAL/PLATELET - Abnormal; Notable for the following components:      Result Value   RDW 11.4 (*)    All other components within normal limits  COMPREHENSIVE METABOLIC PANEL - Abnormal; Notable for the following components:   Sodium 132 (*)    Glucose, Bld 105 (*)    All other components within normal limits  TSH    EKG  If  EKG performed, see my interpretation in the MDM section  Radiology No results found.   Procedures Procedures (including critical care time)  Medications Ordered in UC Medications - No data to display  Initial Impression / Assessment and Plan / UC Course  I have reviewed the triage vital signs and the nursing notes.  Pertinent labs & imaging results that were available during my care of the patient were reviewed by me and considered in my medical decision making (see chart for details).       Patient is a 32 y.o. male  who presents for concern for panic attack and tongue tingling.  Overall patient is clammy and trembling. Mildly hypertensive at 143/99. He is not tachycardic.  Exam is unremarkable outside of clamminess and tachy mucus membranes after taking Benadryl. Pt concerned that he is a having anaphylactic response as his tongue is tingling. Recommended Vit B12 testing but he declined as he drinks lots of energy drinks that continue B12. Doubts he is deficient. TSH obtained to ensure he doesn't have hyperthyroidism as his mom has auto-immune thyroid disease. CBC and CMP are normal. Not likely infectious or inflammatory in nature. No kidney or liver function derailments.  He has mild hyponatremia (Na 133) not likely the cause of his symptoms. Results reviewed with patient reassurance provided. He has no new medications, supplements, foods or exposures. Doubt anaphylaxis but more likely panic.   He has history of anxiety and panic attacks. He is taking Adderall but no longer taking his Lexapro. He has an appointment for a checkup with his PCP tomorrow and advised to discuss restarting Lexapro as Adderall can make panic disorder and anxiety worse.   ED and return precautions given and patient/guardian voiced understanding. Discussed MDM, treatment plan and plan for follow-up with patient who agrees with plan.    Final Clinical Impressions(s) / UC Diagnoses   Final diagnoses:   Paresthesia of tongue  Panic attack     Discharge Instructions      Speak with your primary care provider about restarting Lexapro.  Adderall can make panic attacks and anxiety worse. Consider tapering down off of this medication.   Your blood work was mostly normal. Your thyroid test will return later tonight or tomorrow. If abnormal, someone will contact you.       ED Prescriptions   None  PDMP not reviewed this encounter.   Katha Cabal, DO 06/12/23 1222

## 2023-06-09 NOTE — Discharge Instructions (Signed)
Speak with your primary care provider about restarting Lexapro.  Adderall can make panic attacks and anxiety worse. Consider tapering down off of this medication.   Your blood work was mostly normal. Your thyroid test will return later tonight or tomorrow. If abnormal, someone will contact you.

## 2023-06-09 NOTE — ED Triage Notes (Signed)
Pt states he clocked in at work and felt his tongue tingling he started to panic because he had an anaphylactic reaction as a kid and thought the same thing was happening. Pt denies coming into contact with anything before his symptoms started. He also c/o dry mouth and cold sweats.

## 2024-04-01 ENCOUNTER — Ambulatory Visit
Admission: EM | Admit: 2024-04-01 | Discharge: 2024-04-01 | Disposition: A | Discharge status: Home / Self Care | Attending: Emergency Medicine | Admitting: Emergency Medicine

## 2024-04-01 ENCOUNTER — Encounter: Payer: Self-pay | Admitting: Emergency Medicine

## 2024-04-01 DIAGNOSIS — R040 Epistaxis: Secondary | ICD-10-CM

## 2024-04-01 NOTE — Discharge Instructions (Addendum)
 The source of your bloody postnasal drip is unclear so I have referred you to ear nose and throat for further evaluation.  If you develop overt nosebleed, have an increase in bloody postnasal drip, start coughing up blood more frequently that is more than 2 tablespoons I recommend you go to the emergency department for further evaluation.

## 2024-04-01 NOTE — ED Provider Notes (Signed)
 [facility]    CSN: [id number] Arrival date & time: 04/01/24  0835      History   Chief Complaint Chief Complaint  Patient presents with   Sinus Problem    HPI Seth Nicholson is a 33 y.o. male.   HPI  33 year old male with past medical history significant for anxiety presents for evaluation of bloody postnasal drip.  He reports that his symptoms have been going on for the last month and that he will feel drainage which triggers a cough and he will spit out blood.  He states that he tasted blood in the back of his mouth and he also spits blood when he brushes his teeth.  He denies any nosebleed or bloody discharge from blowing, cough, or shortness of breath.  Also no fever.  Past Medical History:  Diagnosis Date   Anxiety     There are no active problems to display for this patient.   History reviewed. No pertinent surgical history.     Home Medications    Prior to Admission medications   Medication Sig Start Date End Date Taking? Authorizing Provider  amphetamine-dextroamphetamine (ADDERALL) 20 MG tablet Take by mouth. 04/09/23 04/01/24 Yes [provider]    Family History History reviewed. No pertinent family history.  Social History Social History   Tobacco Use   Smoking status: Former    Current packs/day: 1.00    Types: Cigarettes   Smokeless tobacco: Never  Vaping Use   Vaping status: Every Day  Substance Use Topics   Alcohol use: Yes   Drug use: Not Currently    Types: Cocaine     Allergies   Barbiturates   Review of Systems Review of Systems  Constitutional:  Negative for fever.  HENT:  Positive for postnasal drip. Negative for congestion and sinus pressure.   Respiratory:  Negative for cough, shortness of breath and wheezing.      Physical Exam Triage Vital Signs ED Triage Vitals  Encounter Vitals Group     BP 04/01/24 0859 127/82     Systolic BP Percentile --      Diastolic BP Percentile --      Pulse Rate  04/01/24 0859 60     Resp 04/01/24 0859 15     Temp 04/01/24 0859 97.8 F (36.6 C)     Temp Source 04/01/24 0859 Oral     SpO2 04/01/24 0859 100 %     Weight 04/01/24 0856 164 lb 14.5 oz (74.8 kg)     Height 04/01/24 0856 5\' 8"  (1.727 m)     Head Circumference --      Peak Flow --      Pain Score 04/01/24 0856 0     Pain Loc --      Pain Education --      Exclude from Growth Chart --    No data found.  Updated Vital Signs BP 127/82 (BP Location: Right Arm)   Pulse 60   Temp 97.8 F (36.6 C) (Oral)   Resp 15   Ht 5\' 8"  (1.727 m)   Wt 164 lb 14.5 oz (74.8 kg)   SpO2 100%   BMI 25.07 kg/m   Visual Acuity Right Eye Distance:   Left Eye Distance:   Bilateral Distance:    Right Eye Near:   Left Eye Near:    Bilateral Near:     Physical Exam Vitals and nursing note reviewed.  Constitutional:      Appearance: Normal  appearance. He is not ill-appearing.  HENT:     Head: Normocephalic and atraumatic.     Nose: Congestion present. No rhinorrhea.     Comments: Nasal mucosa is edematous and erythematous without discharge or epistaxis.  No tenderness to compression of bilateral frontal or maxillary sinuses.    Mouth/Throat:     Mouth: Mucous membranes are moist.     Pharynx: Oropharynx is clear. No oropharyngeal exudate or posterior oropharyngeal erythema.     Comments: Posterior oropharynx is benign.  No bloody drainage noted. Cardiovascular:     Rate and Rhythm: Normal rate and regular rhythm.     Pulses: Normal pulses.     Heart sounds: Normal heart sounds. No murmur heard.    No friction rub. No gallop.  Pulmonary:     Effort: Pulmonary effort is normal.     Breath sounds: Normal breath sounds. No wheezing, rhonchi or rales.  Musculoskeletal:     Cervical back: Normal range of motion and neck supple. No tenderness.  Lymphadenopathy:     Cervical: No cervical adenopathy.  Skin:    General: Skin is warm and dry.     Capillary Refill: Capillary refill takes less  than 2 seconds.     Findings: No rash.  Neurological:     General: No focal deficit present.     Mental Status: He is alert and oriented to person, place, and time.      UC Treatments / Results  Labs (all labs ordered are listed, but only abnormal results are displayed) Labs Reviewed - No data to display  EKG   Radiology No results found.  Procedures Procedures (including critical care time)  Medications Ordered in UC Medications - No data to display  Initial Impression / Assessment and Plan / UC Course  I have reviewed the triage vital signs and the nursing notes.  Pertinent labs & imaging results that were available during my care of the patient were reviewed by me and considered in my medical decision making (see chart for details).   Patient is a nontoxic-appearing 33 year old male presenting for evaluation of bloody postnasal drip as outlined HPI above.  The patient insists that he will feel drainage in the back of his throat, clear his throat with a cough and will spit out blood but he denies bloody discharge from his nose he denies epistaxis.  He also denies cough, shortness breath, or wheezing.  His physical exam does reveal inflammation of his upper respiratory tract but no evidence of blood in either nare and no blood present in the posterior oropharynx.  His lungs are clear to auscultation all fields.  He reports that his symptoms began after working with some drywall so he attributed to possible irritation from the drywall dust but it has continued for over a month.  Given that he is not demonstrating any obvious source for his bleeding I will refer him to ear nose and throat for further evaluation.   Final Clinical Impressions(s) / UC Diagnoses   Final diagnoses:  Posterior epistaxis     Discharge Instructions      The source of your bloody postnasal drip is unclear so I have referred you to ear nose and throat for further evaluation.  If you develop overt  nosebleed, have an increase in bloody postnasal drip, start coughing up blood more frequently that is more than 2 tablespoons I recommend you go to the emergency department for further evaluation.     ED Prescriptions  None    PDMP not reviewed this encounter.   Kent Pear, NP 04/01/24 0930

## 2024-04-01 NOTE — ED Triage Notes (Signed)
 Patient states that he woke up this morning and has had some bloody drainage.  Patient states that he can spitt the blood out and can taste the blood in his mouth and when he swallows.  Patient denies any nose bleeding.
